# Patient Record
Sex: Male | Born: 1970 | Race: White | Hispanic: No | Marital: Married | Smoking: Never smoker
Health system: Southern US, Community
[De-identification: ages and names within clinical notes are randomized; demographics above are authoritative.]

## PROBLEM LIST (undated history)

## (undated) DIAGNOSIS — K219 Gastro-esophageal reflux disease without esophagitis: Secondary | ICD-10-CM

## (undated) DIAGNOSIS — I10 Essential (primary) hypertension: Secondary | ICD-10-CM

---

## 2002-01-01 ENCOUNTER — Emergency Department (HOSPITAL_COMMUNITY): Admission: EM | Admit: 2002-01-01 | Discharge: 2002-01-01 | Payer: Self-pay | Admitting: *Deleted

## 2002-01-01 ENCOUNTER — Encounter: Payer: Self-pay | Admitting: *Deleted

## 2003-10-24 ENCOUNTER — Ambulatory Visit (HOSPITAL_COMMUNITY): Admission: RE | Admit: 2003-10-24 | Discharge: 2003-10-24 | Payer: Self-pay | Admitting: Pulmonary Disease

## 2004-06-28 IMAGING — US US ABDOMEN COMPLETE
1 series · 14 of 25 positions shown · non-contrast
Comparison: none

CLINICAL DATA: Elevated liver function test.
 ULTRASOUND ABDOMEN COMPLETE
 There is no evidence of gallstones or gallbladder wall thickening.  There is no evidence of biliary ductal dilatation with the common bile duct measuring approximately 3 mm.  The liver is within normal limits in parenchymal echogenicity and no focal liver lesions are identified.  Visualized portion of the pancreas is unremarkable.  
 There is no evidence of splenomegaly.  Both kidneys are normal in size and appearance and there is no evidence of hydronephrosis.  There is no evidence of abdominal aortic aneurysm or ascites.
 IMPRESSION
 Negative abdominal ultrasound.

[Series 1: unknown · 0.34mm/px · 14 of 63 slices shown]
[im 1/63]
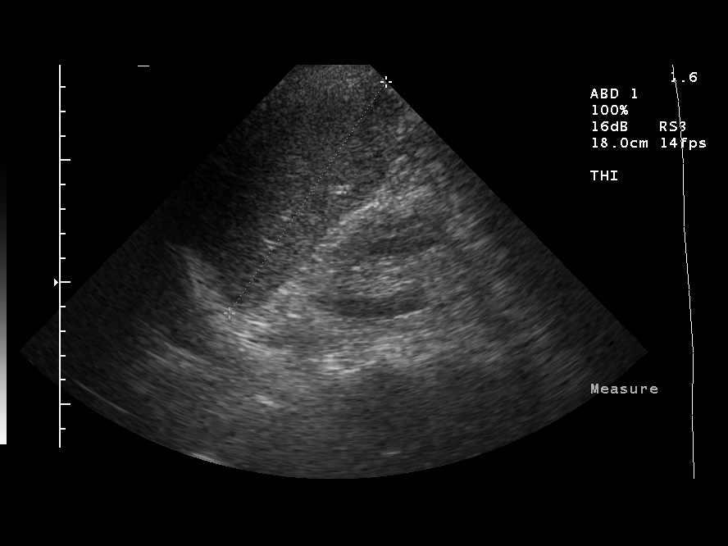
[im 6/63]
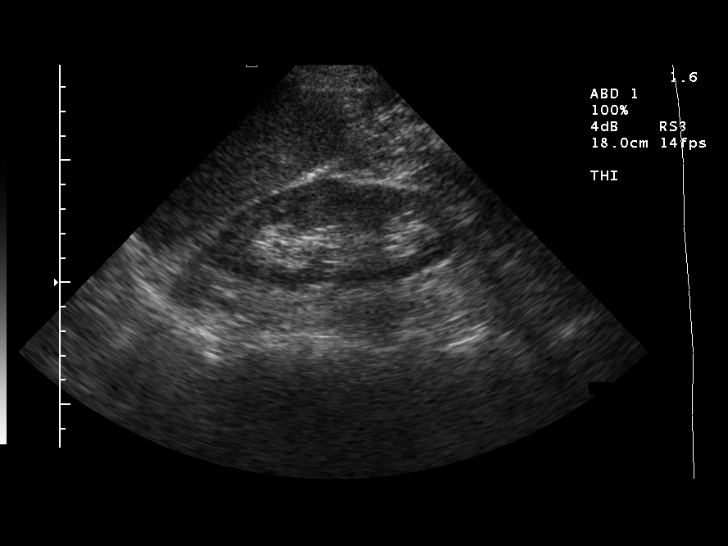
[im 11/63]
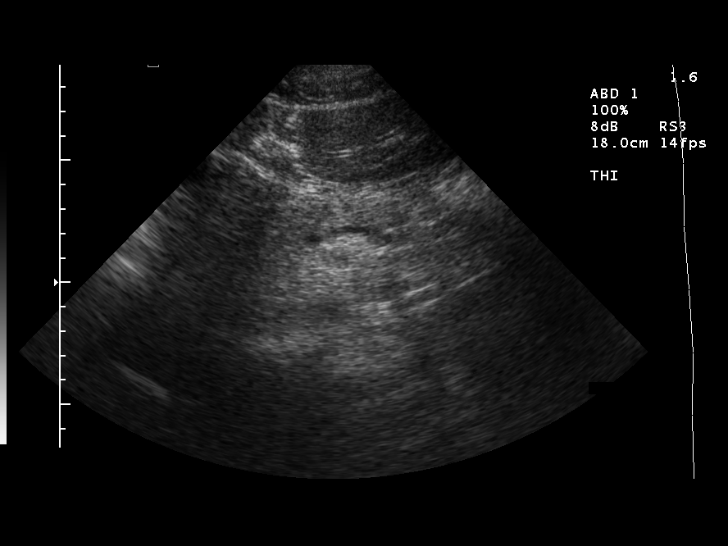
[im 16/63]
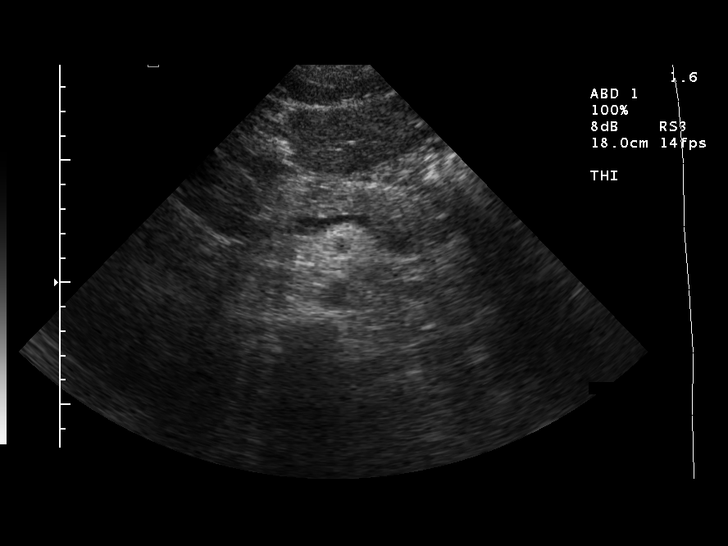
[im 21/63]
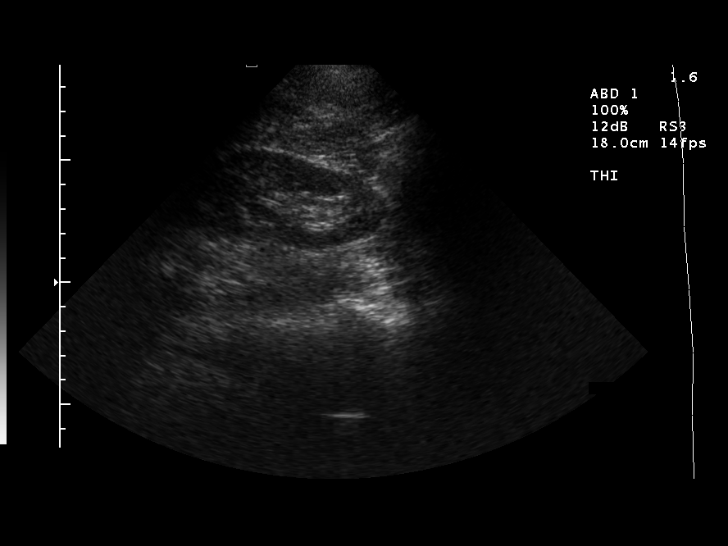
[im 24/63]
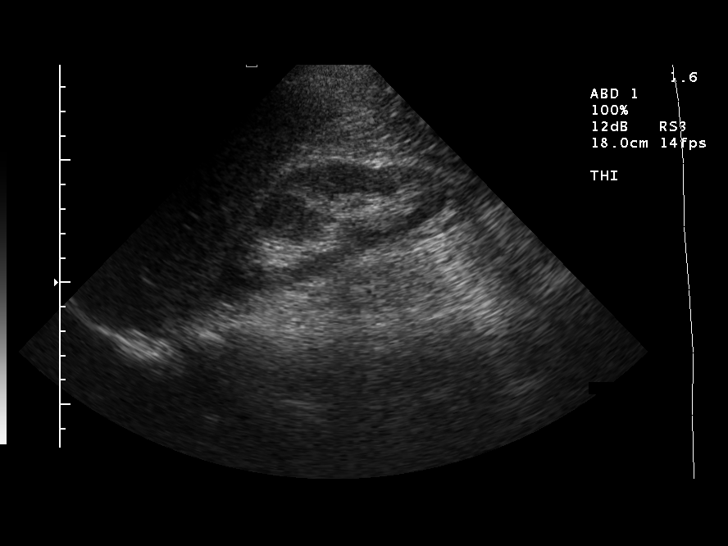
[im 29/63]
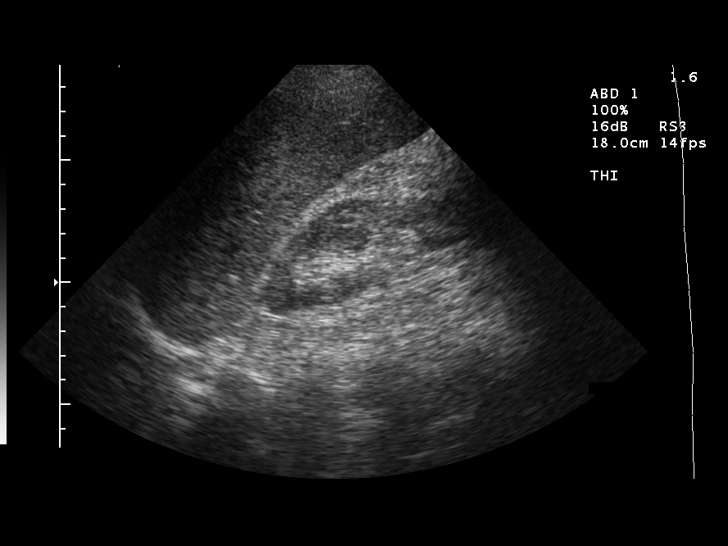
[im 34/63]
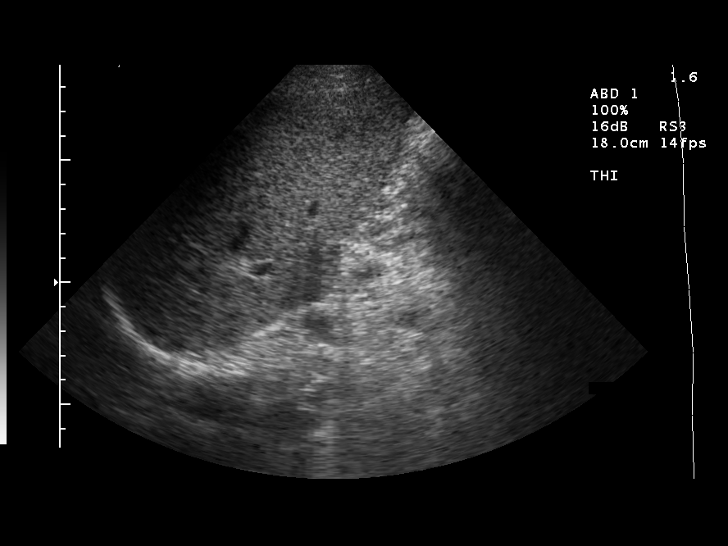
[im 39/63]
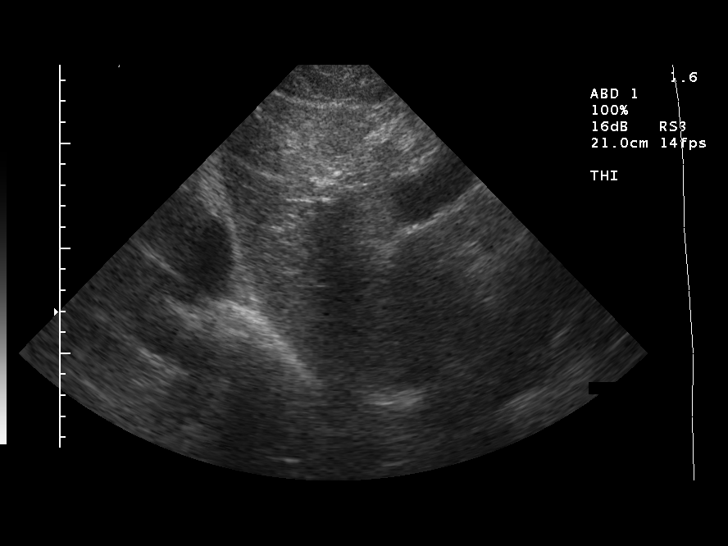
[im 42/63]
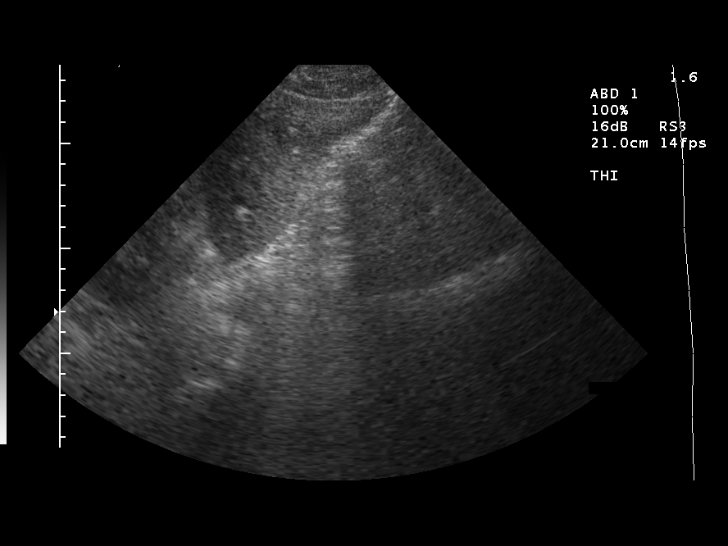
[im 47/63]
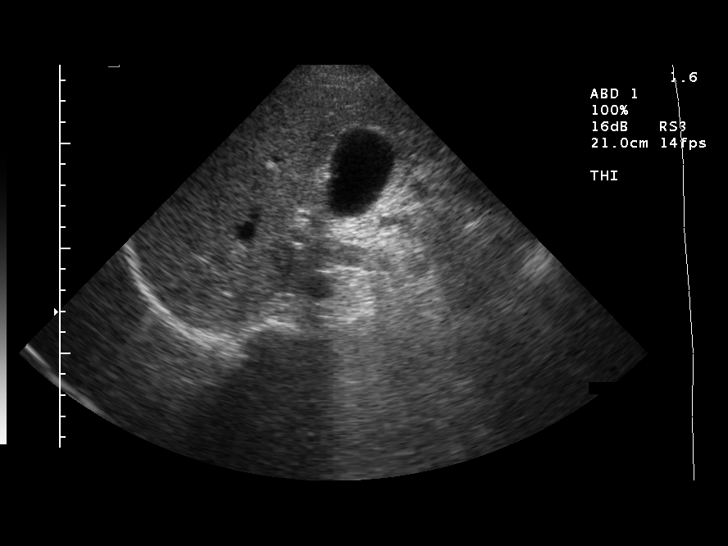
[im 52/63]
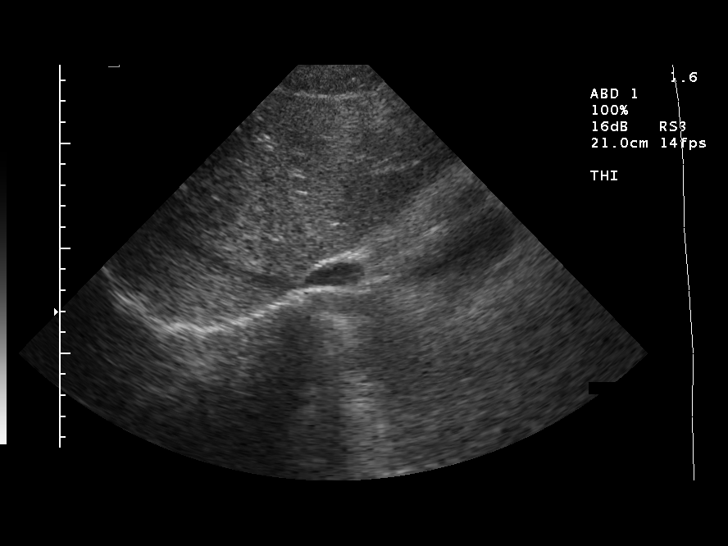
[im 57/63]
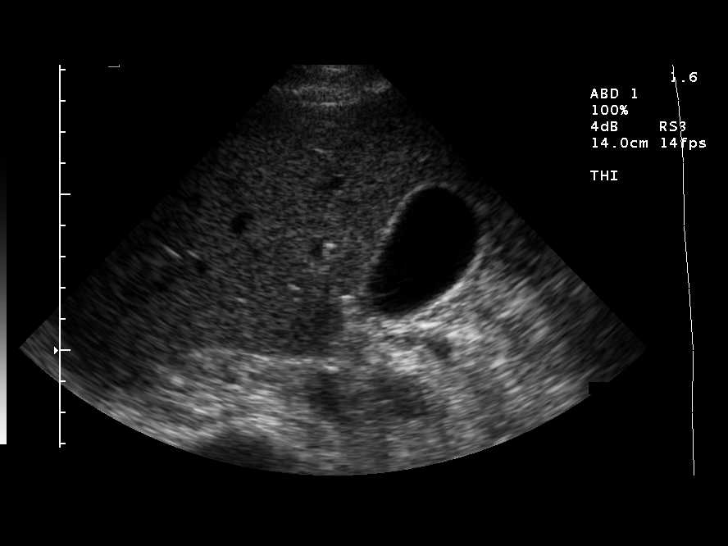
[im 63/63]
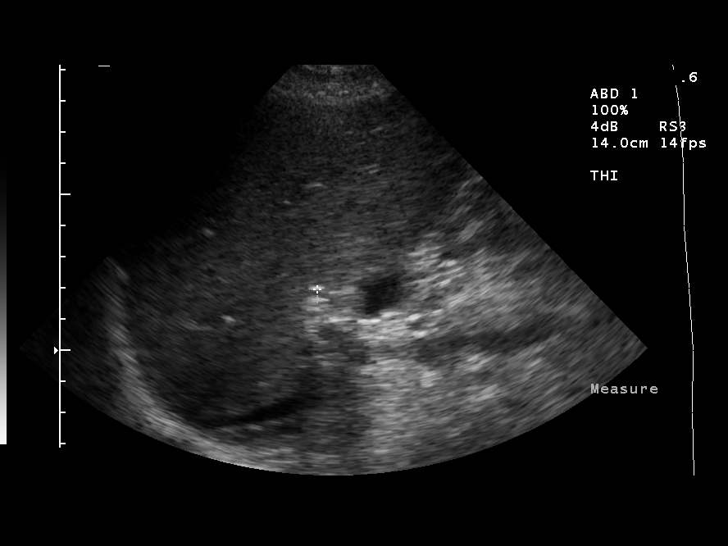

[14 of 25 positions shown; findings below may reference images not displayed]

## 2004-09-26 ENCOUNTER — Emergency Department (HOSPITAL_COMMUNITY): Admission: EM | Admit: 2004-09-26 | Discharge: 2004-09-26 | Payer: Self-pay | Admitting: Emergency Medicine

## 2007-02-23 ENCOUNTER — Ambulatory Visit: Payer: Self-pay | Admitting: Cardiology

## 2007-02-23 ENCOUNTER — Ambulatory Visit (HOSPITAL_COMMUNITY): Admission: RE | Admit: 2007-02-23 | Discharge: 2007-02-23 | Payer: Self-pay | Admitting: Internal Medicine

## 2009-09-23 ENCOUNTER — Emergency Department (HOSPITAL_COMMUNITY): Admission: EM | Admit: 2009-09-23 | Discharge: 2009-09-23 | Payer: Self-pay | Admitting: Emergency Medicine

## 2011-02-12 LAB — COMPREHENSIVE METABOLIC PANEL
ALT: 59 U/L — ABNORMAL HIGH (ref 0–53)
AST: 34 U/L (ref 0–37)
Albumin: 4.9 g/dL (ref 3.5–5.2)
Alkaline Phosphatase: 80 U/L (ref 39–117)
BUN: 30 mg/dL — ABNORMAL HIGH (ref 6–23)
CO2: 19 mEq/L (ref 19–32)
Calcium: 10.3 mg/dL (ref 8.4–10.5)
Chloride: 92 mEq/L — ABNORMAL LOW (ref 96–112)
Creatinine, Ser: 4.46 mg/dL — ABNORMAL HIGH (ref 0.4–1.5)
GFR calc Af Amer: 18 mL/min — ABNORMAL LOW (ref >60)
GFR calc non Af Amer: 15 mL/min — ABNORMAL LOW (ref >60)
Glucose, Bld: 172 mg/dL — ABNORMAL HIGH (ref 70–99)
Potassium: 4 mEq/L (ref 3.5–5.1)
Sodium: 131 mEq/L — ABNORMAL LOW (ref 135–145)
Total Bilirubin: 1.2 mg/dL (ref 0.3–1.2)
Total Protein: 8.7 g/dL — ABNORMAL HIGH (ref 6.0–8.3)

## 2011-02-12 LAB — DIFFERENTIAL
Basophils Relative: 0 % (ref 0–1)
Eosinophils Absolute: 0 10*3/uL (ref 0.0–0.7)
Monocytes Relative: 4 % (ref 3–12)
Neutrophils Relative %: 94 % — ABNORMAL HIGH (ref 43–77)

## 2011-02-12 LAB — URINALYSIS, ROUTINE W REFLEX MICROSCOPIC
Glucose, UA: NEGATIVE mg/dL
Leukocytes, UA: NEGATIVE
Nitrite: NEGATIVE
Protein, ur: 100 mg/dL — AB
Specific Gravity, Urine: >1.03 — ABNORMAL HIGH (ref 1.005–1.030)
Urobilinogen, UA: 0.2 mg/dL (ref 0.0–1.0)
pH: 5 (ref 5.0–8.0)

## 2011-02-12 LAB — CBC
HCT: 55.8 % — ABNORMAL HIGH (ref 39.0–52.0)
Hemoglobin: 19.2 g/dL — ABNORMAL HIGH (ref 13.0–17.0)
MCHC: 34.5 g/dL (ref 30.0–36.0)
MCV: 89.4 fL (ref 78.0–100.0)
Platelets: 244 10*3/uL (ref 150–400)
RBC: 6.23 MIL/uL — ABNORMAL HIGH (ref 4.22–5.81)
RDW: 12.4 % (ref 11.5–15.5)
WBC: 19.6 10*3/uL — ABNORMAL HIGH (ref 4.0–10.5)

## 2011-02-12 LAB — POCT I-STAT, CHEM 8
Glucose, Bld: 114 mg/dL — ABNORMAL HIGH (ref 70–99)
HCT: 44 % (ref 39.0–52.0)
Hemoglobin: 15 g/dL (ref 13.0–17.0)
Potassium: 4.2 mEq/L (ref 3.5–5.1)

## 2011-02-12 LAB — URINE MICROSCOPIC-ADD ON

## 2011-03-28 NOTE — Procedures (Signed)
NAMESHEPPARD, LUCKENBACH NO.:  [id number]   MEDICAL RECORD NO.:  [id number]          PATIENT TYPE:  OUT   LOCATION:  RAD                           FACILITY:  [facility]   PHYSICIAN:  Gerrit Friends. Dietrich Pates, MD, FACCDATE OF BIRTH:  1971/02/19   DATE OF PROCEDURE:  02/23/2007  DATE OF DISCHARGE:                                ECHOCARDIOGRAM   REFERRING PHYSICIAN:  Kingsley Callander. Ouida Sills, MD   CLINICAL DATA:  A 40 year old gentleman with murmur.   M-mode:  Aorta 2.9, left atrium 4.2, septum 1.2, posterior wall 1.2, LV  diastole 4.1, LV systole 3.1.   1. Technically adequate echocardiographic study.  2. The left atrial size at the upper limit of normal; normal right      atrium and right ventricle.  3. Trileaflet and normal aortic valve.  4. Normal mitral, tricuspid and pulmonic valves.  Normal proximal      pulmonary artery.  Normal aortic root.  5. Normal left ventricular size; borderline wall thickness; normal      regional and global systolic function.  6. Normal IVC.  7. No evidence for PDA or coarctation.      Gerrit Friends. Dietrich Pates, MD, [facility]  Electronically Signed     RMR/MEDQ  D:  02/23/2007  T:  02/23/2007  Job:  [id number]

## 2011-08-07 ENCOUNTER — Emergency Department (HOSPITAL_COMMUNITY)
Admission: EM | Admit: 2011-08-07 | Discharge: 2011-08-08 | Disposition: A | Discharge status: Home / Self Care | Payer: 59 | Attending: Emergency Medicine | Admitting: Emergency Medicine

## 2011-08-07 DIAGNOSIS — S61419A Laceration without foreign body of unspecified hand, initial encounter: Secondary | ICD-10-CM

## 2011-08-07 DIAGNOSIS — I10 Essential (primary) hypertension: Secondary | ICD-10-CM | POA: Insufficient documentation

## 2011-08-07 DIAGNOSIS — S61409A Unspecified open wound of unspecified hand, initial encounter: Secondary | ICD-10-CM | POA: Insufficient documentation

## 2011-08-07 DIAGNOSIS — K219 Gastro-esophageal reflux disease without esophagitis: Secondary | ICD-10-CM | POA: Insufficient documentation

## 2011-08-07 DIAGNOSIS — IMO0002 Reserved for concepts with insufficient information to code with codable children: Secondary | ICD-10-CM | POA: Insufficient documentation

## 2011-08-07 HISTORY — DX: Gastro-esophageal reflux disease without esophagitis: K21.9

## 2011-08-07 HISTORY — DX: Essential (primary) hypertension: I10
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
## 2011-08-08 MED ORDER — TETANUS-DIPHTH-ACELL PERTUSSIS 5-2.5-18.5 LF-MCG/0.5 IM SUSP
0.5000 mL | Freq: Once | INTRAMUSCULAR | Status: AC
  Administered 2011-08-08: 0.5 mL via INTRAMUSCULAR
  Filled 2011-08-08: qty 0.5

## 2011-08-08 MED ORDER — CEPHALEXIN 500 MG PO CAPS: 500.0000 mg | ORAL_CAPSULE | Freq: Four times a day (QID) | ORAL | Status: AC

## 2011-08-08 MED ORDER — LIDOCAINE-EPINEPHRINE (PF) 2 %-1:200000 IJ SOLN
10.0000 mL | Freq: Once | INTRAMUSCULAR | Status: DC
  Filled 2011-08-08: qty 20

## 2011-08-08 MED ORDER — CEPHALEXIN 500 MG PO CAPS
500.0000 mg | ORAL_CAPSULE | Freq: Once | ORAL | Status: AC
  Administered 2011-08-08: 500 mg via ORAL
  Filled 2011-08-08: qty 1

## 2011-08-08 NOTE — ED Provider Notes (Signed)
Medical screening examination/treatment/procedure(s) were performed by non-physician practitioner and as supervising physician I was immediately available for consultation/collaboration.   Geoffery Lyons, MD 08/08/11 [phone]
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
## 2011-08-08 NOTE — ED Notes (Signed)
Wound on right hand dressed and bandaged.

## 2012-02-17 ENCOUNTER — Emergency Department (HOSPITAL_COMMUNITY)
Admission: EM | Admit: 2012-02-17 | Discharge: 2012-02-17 | Disposition: A | Discharge status: Home / Self Care | Payer: Worker's Compensation | Attending: Emergency Medicine | Admitting: Emergency Medicine

## 2012-02-17 ENCOUNTER — Other Ambulatory Visit: Payer: Self-pay

## 2012-02-17 ENCOUNTER — Encounter (HOSPITAL_COMMUNITY): Payer: Self-pay

## 2012-02-17 DIAGNOSIS — K219 Gastro-esophageal reflux disease without esophagitis: Secondary | ICD-10-CM | POA: Insufficient documentation

## 2012-02-17 DIAGNOSIS — Y9269 Other specified industrial and construction area as the place of occurrence of the external cause: Secondary | ICD-10-CM | POA: Insufficient documentation

## 2012-02-17 DIAGNOSIS — T23201A Burn of second degree of right hand, unspecified site, initial encounter: Secondary | ICD-10-CM

## 2012-02-17 DIAGNOSIS — T23259A Burn of second degree of unspecified palm, initial encounter: Secondary | ICD-10-CM | POA: Insufficient documentation

## 2012-02-17 DIAGNOSIS — W868XXA Exposure to other electric current, initial encounter: Secondary | ICD-10-CM | POA: Insufficient documentation

## 2012-02-17 DIAGNOSIS — I498 Other specified cardiac arrhythmias: Secondary | ICD-10-CM | POA: Insufficient documentation

## 2012-02-17 DIAGNOSIS — I10 Essential (primary) hypertension: Secondary | ICD-10-CM | POA: Insufficient documentation

## 2012-02-17 LAB — DIFFERENTIAL
Basophils Absolute: 0.1 10*3/uL (ref 0.0–0.1)
Basophils Relative: 1 % (ref 0–1)
Neutro Abs: 10 10*3/uL — ABNORMAL HIGH (ref 1.7–7.7)
Neutrophils Relative %: 74 % (ref 43–77)

## 2012-02-17 LAB — COMPREHENSIVE METABOLIC PANEL
AST: 32 U/L (ref 0–37)
Albumin: 4.4 g/dL (ref 3.5–5.2)
Alkaline Phosphatase: 119 U/L — ABNORMAL HIGH (ref 39–117)
Chloride: 100 mEq/L (ref 96–112)
Potassium: 3.7 mEq/L (ref 3.5–5.1)
Sodium: 139 mEq/L (ref 135–145)
Total Bilirubin: 0.2 mg/dL — ABNORMAL LOW (ref 0.3–1.2)

## 2012-02-17 LAB — CBC
Hemoglobin: 16.3 g/dL (ref 13.0–17.0)
MCHC: 35.1 g/dL (ref 30.0–36.0)
RDW: 12.7 % (ref 11.5–15.5)

## 2012-02-17 MED ORDER — ONDANSETRON HCL 4 MG/2ML IJ SOLN
4.0000 mg | Freq: Once | INTRAMUSCULAR | Status: AC
  Administered 2012-02-17: 4 mg via INTRAVENOUS
  Filled 2012-02-17: qty 2

## 2012-02-17 MED ORDER — HYDROMORPHONE HCL PF 1 MG/ML IJ SOLN
1.0000 mg | Freq: Once | INTRAMUSCULAR | Status: AC
  Administered 2012-02-17: 1 mg via INTRAVENOUS
  Filled 2012-02-17: qty 1

## 2012-02-17 MED ORDER — SILVER SULFADIAZINE 1 % EX CREA
TOPICAL_CREAM | Freq: Once | CUTANEOUS | Status: AC
  Administered 2012-02-17: 1 via TOPICAL
  Filled 2012-02-17: qty 50

## 2012-02-17 MED ORDER — HYDROMORPHONE HCL PF 2 MG/ML IJ SOLN
2.0000 mg | Freq: Once | INTRAMUSCULAR | Status: AC
  Administered 2012-02-17: 2 mg via INTRAVENOUS
  Filled 2012-02-17: qty 1

## 2012-02-17 MED ORDER — BACITRACIN-NEOMYCIN-POLYMYXIN 400-5-5000 EX OINT
TOPICAL_OINTMENT | Freq: Once | CUTANEOUS | Status: AC
  Administered 2012-02-17: 1 via TOPICAL
  Filled 2012-02-17: qty 4

## 2012-02-17 MED ORDER — CEPHALEXIN 500 MG PO CAPS: 500.0000 mg | ORAL_CAPSULE | Freq: Four times a day (QID) | ORAL | Status: AC

## 2012-02-17 MED ORDER — CEPHALEXIN 500 MG PO CAPS
1000.0000 mg | ORAL_CAPSULE | Freq: Once | ORAL | Status: AC
  Administered 2012-02-17: 1000 mg via ORAL
  Filled 2012-02-17: qty 2

## 2012-02-17 MED ORDER — OXYCODONE-ACETAMINOPHEN 5-325 MG PO TABS: 2.0000 | ORAL_TABLET | ORAL | Status: AC | PRN

## 2012-02-17 MED ORDER — ONDANSETRON HCL 8 MG PO TABS: 8.0000 mg | ORAL_TABLET | ORAL | Status: AC | PRN

## 2012-02-17 MED ORDER — SODIUM CHLORIDE 0.9 % IV SOLN
Freq: Once | INTRAVENOUS | Status: AC
  Administered 2012-02-17: 16:00:00 via INTRAVENOUS

## 2012-02-17 NOTE — ED Notes (Signed)
Dressing applied to right hand with neosporin and vaseline gauze with kling.
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
## 2012-02-17 NOTE — ED Notes (Signed)
Pt resting quietly on stretcher. Iced saline dressings remain in place right hand. Pt c/o burning but states that it doesn't hurt as bad as long as the ice is on it. Cardiac monitor showing ST. Family at bedside.

## 2012-02-17 NOTE — Discharge Instructions (Signed)
Can soak in ice water intermittently tonight.  Simple dressing with his Neosporin ointment.  Prescriptions for antibiotic, pain medicine, nausea medicine. Can eat tonight. Follow up with specialist tomorrow at 1 PM as noted
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
## 2012-02-17 NOTE — ED Notes (Signed)
Pt's hand placed in cold sterile water solution. Pt c/o burning pain. Family at bedside.

## 2019-04-26 ENCOUNTER — Other Ambulatory Visit: Payer: Self-pay

## 2019-04-26 DIAGNOSIS — Z20822 Contact with and (suspected) exposure to covid-19: Secondary | ICD-10-CM

## 2019-04-26 NOTE — Progress Notes (Signed)
[id number] 

## 2019-04-29 LAB — NOVEL CORONAVIRUS, NAA: SARS-CoV-2, NAA: NOT DETECTED

## 2019-07-21 ENCOUNTER — Other Ambulatory Visit: Payer: Self-pay

## 2019-07-21 DIAGNOSIS — Z20822 Contact with and (suspected) exposure to covid-19: Secondary | ICD-10-CM

## 2019-07-22 LAB — NOVEL CORONAVIRUS, NAA: SARS-CoV-2, NAA: NOT DETECTED
